# Patient Record
Sex: Female | Born: 1965 | Race: Black or African American | Hispanic: No | Marital: Married | Smoking: Never smoker
Health system: Southern US, Community
[De-identification: ages and names within clinical notes are randomized; demographics above are authoritative.]

## PROBLEM LIST (undated history)

## (undated) DIAGNOSIS — I10 Essential (primary) hypertension: Secondary | ICD-10-CM

---

## 2019-06-03 ENCOUNTER — Institutional Professional Consult (permissible substitution): Payer: Self-pay | Admitting: Critical Care Medicine

## 2020-01-25 ENCOUNTER — Other Ambulatory Visit: Payer: Self-pay

## 2020-01-25 ENCOUNTER — Emergency Department (INDEPENDENT_AMBULATORY_CARE_PROVIDER_SITE_OTHER): Payer: BLUE CROSS/BLUE SHIELD

## 2020-01-25 ENCOUNTER — Emergency Department
Admission: RE | Admit: 2020-01-25 | Discharge: 2020-01-25 | Disposition: A | Discharge status: Home / Self Care | Payer: BLUE CROSS/BLUE SHIELD | Source: Ambulatory Visit | Attending: Family Medicine | Admitting: Family Medicine

## 2020-01-25 VITALS — BP 144/83 | HR 92 | Temp 98.4°F | Resp 16 | Ht 66.0 in | Wt 190.0 lb

## 2020-01-25 DIAGNOSIS — M25561 Pain in right knee: Secondary | ICD-10-CM | POA: Diagnosis not present

## 2020-01-25 DIAGNOSIS — R2241 Localized swelling, mass and lump, right lower limb: Secondary | ICD-10-CM

## 2020-01-25 DIAGNOSIS — M1711 Unilateral primary osteoarthritis, right knee: Secondary | ICD-10-CM | POA: Diagnosis not present

## 2020-01-25 HISTORY — DX: Essential (primary) hypertension: I10

## 2020-01-25 NOTE — ED Provider Notes (Signed)
[facility]    CSN: [id number] Arrival date & time: 01/25/20  1806      History   Chief Complaint Chief Complaint  Patient presents with  . Knee Pain    HPI Tiffany Alvarez is a 55 y.o. female.   Yesterday afternoon while taking a shower, patient's right knee suddenly gave way followed by pain/swelling.  Her knee now feels better but it still feels unstable.  She recalls no recent injury or change in activities.  She has had mild improvement after taking Advil 400mg   The history is provided by the patient.  Knee Pain Location:  Knee Time since incident:  1 day Injury: no   Knee location:  R knee Pain details:    Quality:  Aching   Radiates to:  Does not radiate   Severity:  Moderate   Onset quality:  Sudden   Duration:  1 day   Timing:  Constant   Progression:  Improving Chronicity:  New Dislocation: no   Prior injury to area:  No Relieved by:  Nothing Worsened by:  Extension and bearing weight Associated symptoms: decreased ROM, stiffness and swelling   Associated symptoms: no muscle weakness     Past Medical History:  Diagnosis Date  . Hypertension     There are no problems to display for this patient.   Past Surgical History:  Procedure Laterality Date  . CESAREAN SECTION      OB History   No obstetric history on file.      Home Medications    Prior to Admission medications   Medication Sig Start Date End Date Taking? Authorizing Provider  amLODipine (NORVASC) 10 MG tablet Take 10 mg by mouth daily.   Yes [provider]  ergocalciferol (VITAMIN D2) 1.25 MG (50000 UT) capsule Take 50,000 Units by mouth once a week.   Yes [provider]  fexofenadine (ALLEGRA) 60 MG tablet Take 60 mg by mouth 2 (two) times daily.   Yes [provider]  norethindrone-ethinyl estradiol (LOESTRIN FE) 1-20 MG-MCG tablet Take 1 tablet by mouth daily.   Yes [provider]    Family History No family history on  file.  Social History Social History   Tobacco Use  . Smoking status: Never Smoker  . Smokeless tobacco: Never Used     Allergies   Lisinopril   Review of Systems Review of Systems  Musculoskeletal: Positive for stiffness.  All other systems reviewed and are negative.    Physical Exam Triage Vital Signs ED Triage Vitals  Enc Vitals Group     BP 01/25/20 1839 (!) 144/83     Pulse Rate 01/25/20 1839 92     Resp 01/25/20 1839 16     Temp 01/25/20 1839 98.4 F (36.9 C)     Temp Source 01/25/20 1839 Oral     SpO2 01/25/20 1839 97 %     Weight 01/25/20 1844 190 lb (86.2 kg)     Height 01/25/20 1844 5\' 6"  (1.676 m)     Head Circumference --      Peak Flow --      Pain Score 01/25/20 1843 4     Pain Loc --      Pain Edu? --      Excl. in GC? --    No data found.  Updated Vital Signs BP (!) 144/83 (BP Location: Right Arm)   Pulse 92   Temp 98.4 F (36.9 C) (Oral)   Resp 16  Ht 5\' 6"  (1.676 m)   Wt 86.2 kg   SpO2 97%   BMI 30.67 kg/m   Visual Acuity Right Eye Distance:   Left Eye Distance:   Bilateral Distance:    Right Eye Near:   Left Eye Near:    Bilateral Near:     Physical Exam Vitals and nursing note reviewed.  Constitutional:      General: She is not in acute distress. HENT:     Head: Normocephalic.  Cardiovascular:     Rate and Rhythm: Normal rate.  Pulmonary:     Effort: Pulmonary effort is normal.  Musculoskeletal:     Right knee: No swelling, deformity, erythema, ecchymosis or crepitus. Decreased range of motion. Tenderness present over the patellar tendon. No LCL laxity, MCL laxity, ACL laxity or PCL laxity. Normal alignment, normal meniscus and normal patellar mobility.       Legs:     Comments: Right knee has relatively good range of motion although patient has pain with active extension.  There is tenderness to palpation along lateral and inferior edge of patella.  Skin:    General: Skin is warm and dry.     Findings: No rash.   Neurological:     Mental Status: She is alert.      UC Treatments / Results  Labs (all labs ordered are listed, but only abnormal results are displayed) Labs Reviewed - No data to display  EKG   Radiology DG Knee Complete 4 Views Right  Result Date: 01/25/2020 CLINICAL DATA:  Pain and swelling EXAM: RIGHT KNEE - COMPLETE 4+ VIEW COMPARISON:  None. FINDINGS: No fracture or malalignment. Minimal patellofemoral degenerative change. Trace knee effusion. IMPRESSION: Minimal degenerative change with trace knee effusion. Electronically Signed   By: 01/27/2020 M.D.   On: 01/25/2020 20:14    Procedures Procedures (including critical care time)  Medications Ordered in UC Medications - No data to display  Initial Impression / Assessment and Plan / UC Course  I have reviewed the triage vital signs and the nursing notes.  Pertinent labs & imaging results that were available during my care of the patient were reviewed by me and considered in my medical decision making (see chart for details).    Dispensed hinged knee brace.  Begin range of motion and stretching exercises.  Given instructions for range of motion and stretching exercises.  Followup with Dr[facility] 01/27/2020 ([facility]) if not improving about two weeks.    Final Clinical Impressions(s) / UC Diagnoses   Final diagnoses:  Patellofemoral arthritis of right knee     Discharge Instructions     Apply ice pack for 20 to 30 minutes, 3 to 4 times daily  Continue until pain and swelling decrease.   Wear knee brace for about 2 weeks.  May take Ibuprofen 200mg , 4 tabs every 8 hours with food.     ED Prescriptions    None        Rodney Langton, MD 01/26/20 1851

## 2020-01-25 NOTE — ED Triage Notes (Signed)
Patient here for right knee pain which started yesterday; no known injury; has episodes of knee pain bilaterally periodically without previous diagnosis. Has had covid vaccinations x 2 doses; no influenza vaccination. Took ibuprofen 400mg  po at 1600.

## 2020-01-25 NOTE — Discharge Instructions (Addendum)
Apply ice pack for 20 to 30 minutes, 3 to 4 times daily  Continue until pain and swelling decrease.   Wear knee brace for about 2 weeks.  May take Ibuprofen 200mg , 4 tabs every 8 hours with food.

## 2020-05-02 ENCOUNTER — Emergency Department
Admission: EM | Admit: 2020-05-02 | Discharge: 2020-05-02 | Disposition: A | Discharge status: Home / Self Care | Payer: BLUE CROSS/BLUE SHIELD | Source: Home / Self Care | Attending: Family Medicine | Admitting: Family Medicine

## 2020-05-02 ENCOUNTER — Emergency Department: Admit: 2020-05-02 | Discharge status: Absent without leave | Payer: Self-pay

## 2020-05-02 DIAGNOSIS — R1011 Right upper quadrant pain: Secondary | ICD-10-CM | POA: Diagnosis not present

## 2020-05-02 NOTE — ED Provider Notes (Signed)
[facility]    CSN: [id number] Arrival date & time: 05/02/20  1912      History   Chief Complaint Chief Complaint  Patient presents with  . Abdominal Pain    HPI Tiffany Alvarez is a 55 y.o. female.   HPI 55 year old female presents for intermittent abdominal pain/discomfort for 6 to 7 years.  Reports she is currently menstruating.  Reports abdominal pain only occurs prior to bowel movement.  Patient reports having first colonoscopy at age 78 without complications or polyps.  Reports was advised to have next colonoscopy at age 37.  Patient reports having soft formed bowel movement daily or at least every other day.  She denies diarrhea, hematochezia, or melena.  Denies dysuria, hematuria, increased urinary frequency or difficulty urinating.  Past Medical History:  Diagnosis Date  . Hypertension     There are no problems to display for this patient.   Past Surgical History:  Procedure Laterality Date  . CESAREAN SECTION      OB History   No obstetric history on file.      Home Medications    Prior to Admission medications   Medication Sig Start Date End Date Taking? Authorizing Provider  amLODipine (NORVASC) 10 MG tablet Take 10 mg by mouth daily.    [provider]  ergocalciferol (VITAMIN D2) 1.25 MG (50000 UT) capsule Take 50,000 Units by mouth once a week.    [provider]  fexofenadine (ALLEGRA) 60 MG tablet Take 60 mg by mouth 2 (two) times daily.    [provider]  norethindrone-ethinyl estradiol (LOESTRIN FE) 1-20 MG-MCG tablet Take 1 tablet by mouth daily.    [provider]    Family History History reviewed. No pertinent family history.  Social History Social History   Tobacco Use  . Smoking status: Never Smoker  . Smokeless tobacco: Never Used  Vaping Use  . Vaping Use: Never used  Substance Use Topics  . Alcohol use: Not Currently     Allergies   Lisinopril   Review of Systems Review of  Systems  Constitutional: Negative.   HENT: Negative.   Respiratory: Negative.   Cardiovascular: Negative.   Gastrointestinal: Positive for abdominal pain. Negative for anal bleeding, blood in stool, constipation, diarrhea, nausea, rectal pain and vomiting.  Endocrine: Negative.   Genitourinary: Negative.   Musculoskeletal: Negative.   Neurological: Negative.      Physical Exam Triage Vital Signs ED Triage Vitals  Enc Vitals Group     BP 05/02/20 1951 (!) 161/83     Pulse Rate 05/02/20 1951 79     Resp 05/02/20 1951 18     Temp 05/02/20 1951 98.8 F (37.1 C)     Temp Source 05/02/20 1951 Oral     SpO2 05/02/20 1951 100 %     Weight --      Height --      Head Circumference --      Peak Flow --      Pain Score 05/02/20 1955 0     Pain Loc --      Pain Edu? --      Excl. in GC? --    No data found.  Updated Vital Signs BP (!) 161/83 (BP Location: Right Arm)   Pulse 79   Temp 98.8 F (37.1 C) (Oral)   Resp 18   LMP 04/30/2020   SpO2 100%   Visual Acuity Right Eye Distance:   Left Eye Distance:   Bilateral  Distance:    Right Eye Near:   Left Eye Near:    Bilateral Near:     Physical Exam Constitutional:      Appearance: She is well-developed. She is obese.  HENT:     Head: Normocephalic and atraumatic.     Mouth/Throat:     Mouth: Mucous membranes are moist.  Cardiovascular:     Rate and Rhythm: Normal rate and regular rhythm.     Heart sounds: Normal heart sounds.  Pulmonary:     Effort: Pulmonary effort is normal.     Breath sounds: Normal breath sounds.  Abdominal:     General: Abdomen is flat. Bowel sounds are normal. There is no distension.     Palpations: Abdomen is soft. There is no hepatomegaly, splenomegaly, mass or pulsatile mass.     Tenderness: There is abdominal tenderness in the right upper quadrant. There is no right CVA tenderness, left CVA tenderness, guarding or rebound. Negative signs include Murphy's sign and McBurney's sign.   Skin:    General: Skin is warm and dry.  Neurological:     General: No focal deficit present.     Mental Status: She is alert and oriented to person, place, and time.  Psychiatric:        Mood and Affect: Mood normal.        Behavior: Behavior normal.      UC Treatments / Results  Labs (all labs ordered are listed, but only abnormal results are displayed) Labs Reviewed - No data to display  EKG   Radiology No results found.  Procedures Procedures (including critical care time)  Medications Ordered in UC Medications - No data to display  Initial Impression / Assessment and Plan / UC Course  I have reviewed the triage vital signs and the nursing notes.  Pertinent labs & imaging results that were available during my care of the patient were reviewed by me and considered in my medical decision making (see chart for details).     1. Abdominal pain Final Clinical Impressions(s) / UC Diagnoses   Final diagnoses:  Right upper quadrant abdominal pain     Discharge Instructions     Advised patient to follow-up with [place] on Wednesday morning, 05/04/2020 for CT of abdomen and pelvis without contrast.    ED Prescriptions    None     PDMP not reviewed this encounter.   Trevor Iha, FNP 05/02/20 2026

## 2020-05-02 NOTE — ED Triage Notes (Signed)
Pt c/o abd pain intermittently for 6-7 years. Currently menstruating. Pt states she notices that she only gets the abd pain before she takes a bowel movement. Also states she feels bloated at times. Has seen GYN about this issue in past and wrote it off as ovulation cramps. Happens more frequently than that.

## 2020-05-02 NOTE — Discharge Instructions (Addendum)
Advised patient to follow-up with [place] on Wednesday morning, 05/04/2020 for CT of abdomen and pelvis without contrast.

## 2020-05-04 ENCOUNTER — Telehealth: Payer: Self-pay | Admitting: Family Medicine

## 2020-05-04 ENCOUNTER — Emergency Department (INDEPENDENT_AMBULATORY_CARE_PROVIDER_SITE_OTHER)
Admission: EM | Admit: 2020-05-04 | Discharge: 2020-05-04 | Disposition: A | Discharge status: Home / Self Care | Payer: BLUE CROSS/BLUE SHIELD | Source: Home / Self Care

## 2020-05-04 ENCOUNTER — Other Ambulatory Visit: Payer: Self-pay

## 2020-05-04 ENCOUNTER — Emergency Department (INDEPENDENT_AMBULATORY_CARE_PROVIDER_SITE_OTHER): Payer: BLUE CROSS/BLUE SHIELD

## 2020-05-04 DIAGNOSIS — R1011 Right upper quadrant pain: Secondary | ICD-10-CM

## 2020-05-04 DIAGNOSIS — R1031 Right lower quadrant pain: Secondary | ICD-10-CM | POA: Diagnosis not present

## 2020-05-04 LAB — I-STAT CREATININE (MANUAL ENTRY): Creatinine, Ser: 0.8 (ref 0.50–1.10)

## 2020-05-04 MED ORDER — IOHEXOL 300 MG/ML  SOLN
100.0000 mL | Freq: Once | INTRAMUSCULAR | Status: AC | PRN
  Administered 2020-05-04: 100 mL via INTRAVENOUS

## 2020-05-04 NOTE — Telephone Encounter (Signed)
Patient returned my call ([phone]) regarding previous voicemail message regarding results of this morning CT of abdomen/pelvis with contrast.  Impression was provided to patient during this phone call and advised follow-up with her PCP to order recommended pre-/post contrasted MRI of abdomen by radiology due to solid-appearing 1.0 cm right renal mass-this finding suspicious for renal cell carcinoma.  Additionally indeterminate 1.4 cm right adrenal gland nodule could be further characterized on recommended abdominal MRI.  Patient agreed and verbalized understanding of these instructions and reports she will call her PCP Erling Cruz, MD) now to schedule appointment.

## 2020-05-04 NOTE — Telephone Encounter (Signed)
Called to speak with patient today at 1029 a.m. to review this morning's CT of abdomen and pelvis with contrast impression below.  1.  No acute abdominopelvic findings.  Normal appendix. 2.  Solid-appearing 1.0 cm right renal mass.  Further evaluation with nonemergent pre-/post contrasted MRI of the abdomen is recommended as this finding suspicious for renal cell carcinoma. 3.  Trace bilateral pleural effusions. 4.  Indeterminate 1.4 cm right adrenal gland nodule this can also be further characterized on previously recommended abdominal MRI. 5.  2 low-density lesions within the spleen measuring up to 8 mm which may represent cysts or hemangiomas. 6.  Fibroid uterus.  Advised patient to follow-up with her PCP for recommended MRI of abdomen.

## 2021-05-31 IMAGING — DX DG KNEE COMPLETE 4+V*R*
4 series · 4 of 4 positions shown · non-contrast
Comparison: None.

CLINICAL DATA: Pain and swelling

EXAM:
RIGHT KNEE - COMPLETE 4+ VIEW

[knee ap]
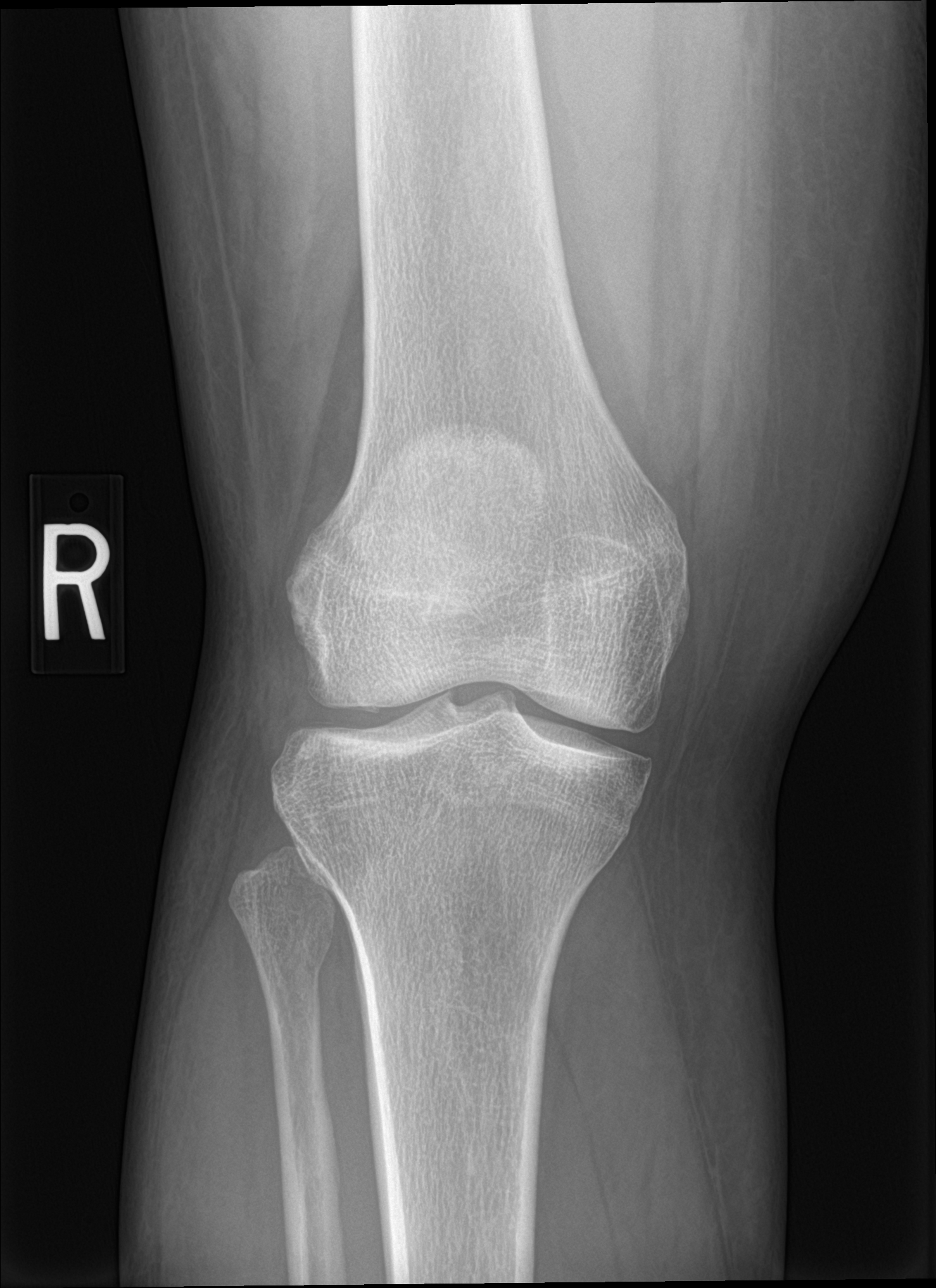

[knee obl (1 of 2)]
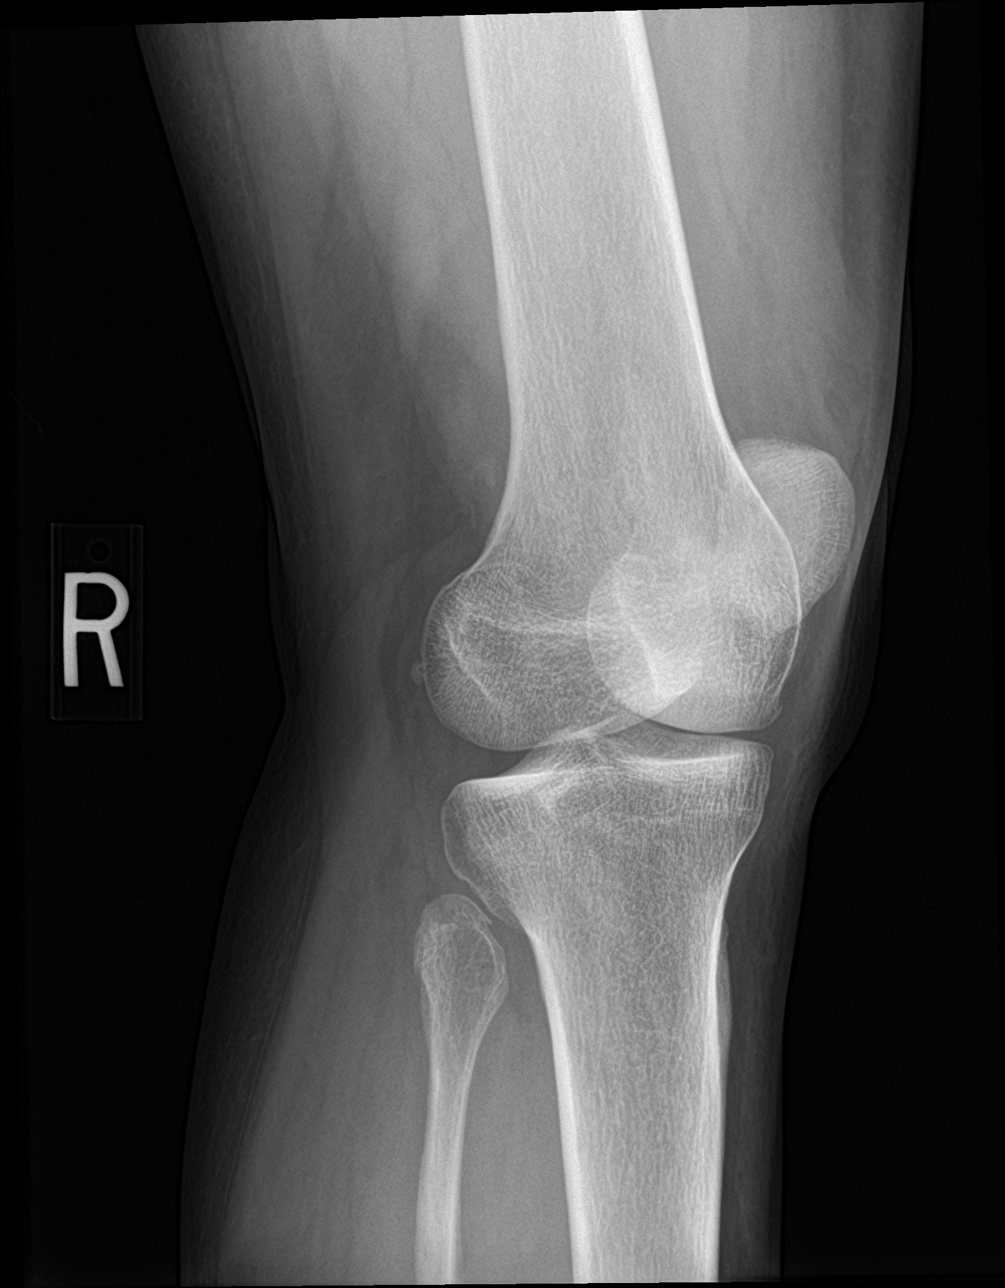

[knee lat]
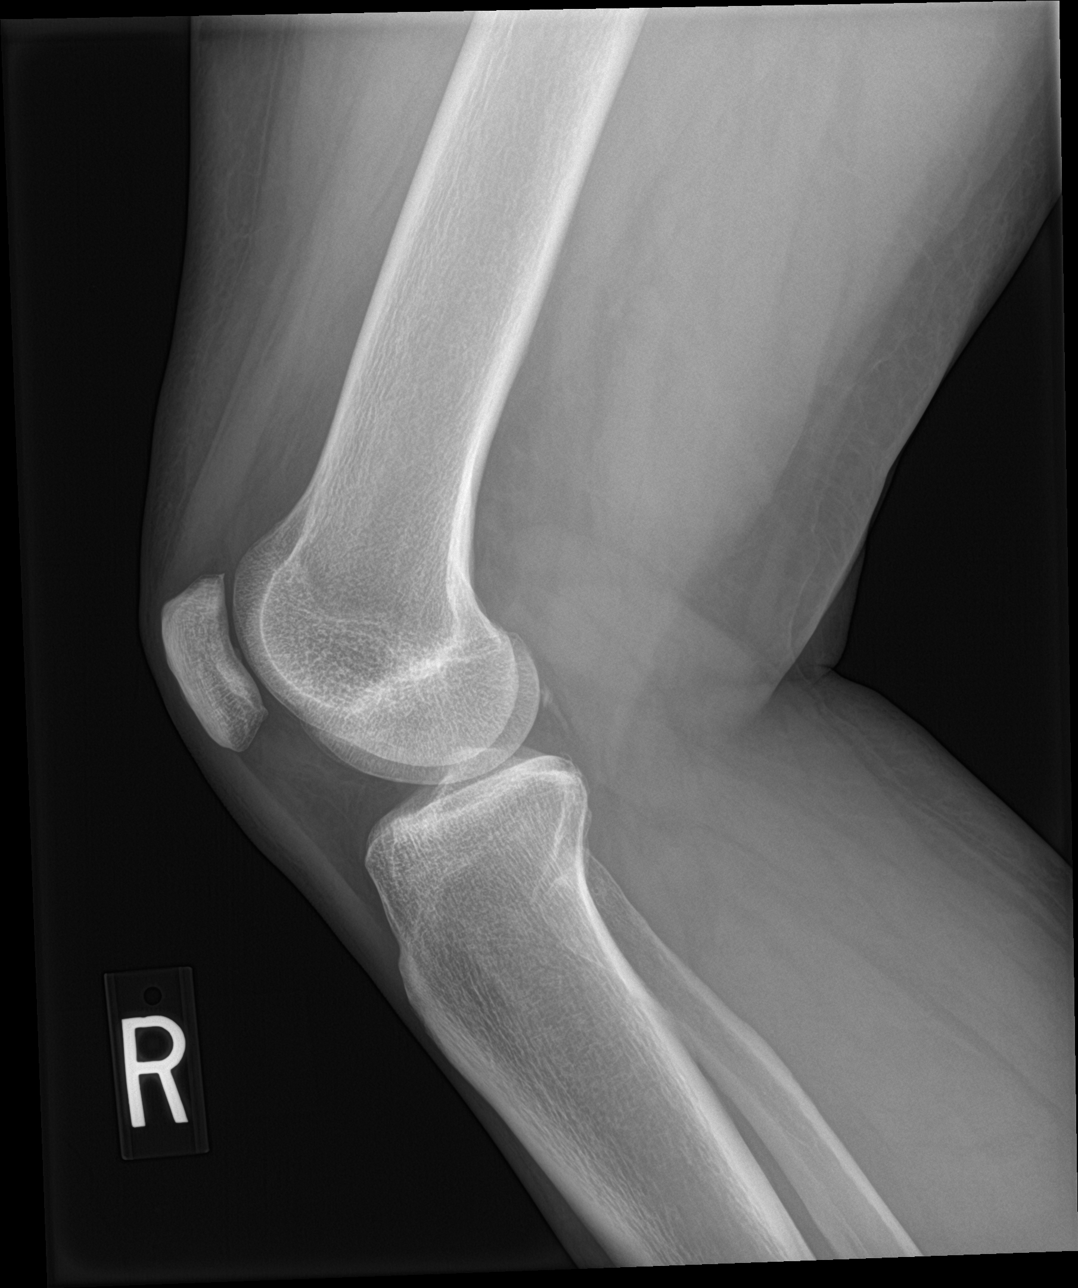

[knee obl (2 of 2)]
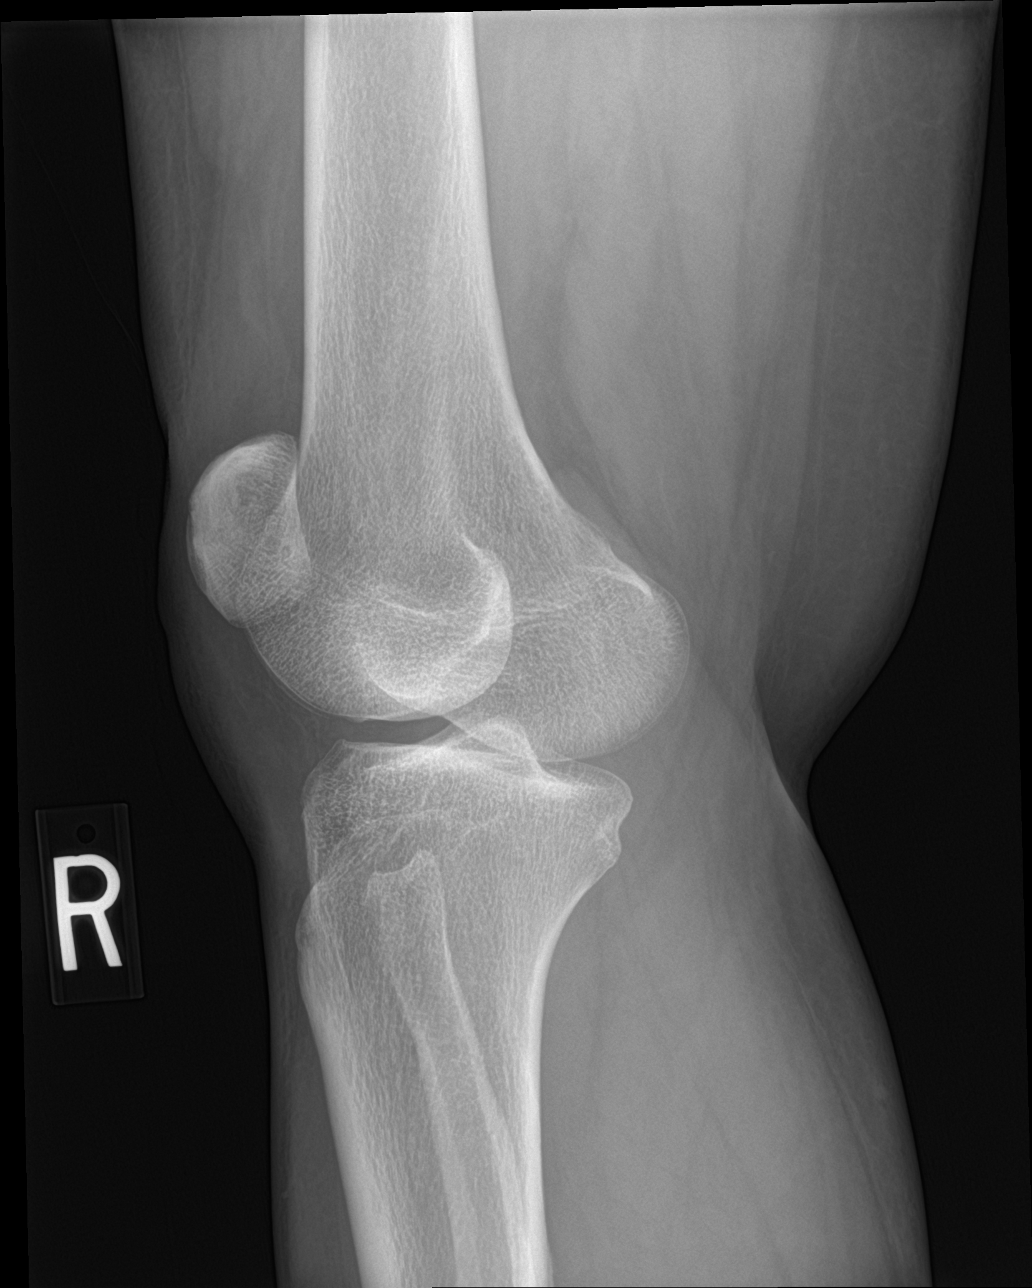

[4 of 4 positions shown; findings below may reference images not displayed]

FINDINGS: No fracture or malalignment. Minimal patellofemoral degenerative
change. Trace knee effusion.
IMPRESSION: Minimal degenerative change with trace knee effusion.

## 2023-04-30 ENCOUNTER — Ambulatory Visit: Admission: EM | Admit: 2023-04-30 | Discharge: 2023-04-30 | Disposition: A | Discharge status: Home / Self Care

## 2023-04-30 ENCOUNTER — Ambulatory Visit

## 2023-04-30 DIAGNOSIS — M542 Cervicalgia: Secondary | ICD-10-CM

## 2023-04-30 DIAGNOSIS — S161XXA Strain of muscle, fascia and tendon at neck level, initial encounter: Secondary | ICD-10-CM | POA: Diagnosis not present

## 2023-04-30 MED ORDER — PREDNISONE 10 MG (21) PO TBPK
ORAL_TABLET | Freq: Every day | ORAL | 0 refills | Status: AC
Start: 2023-04-30 — End: ?

## 2023-04-30 MED ORDER — BACLOFEN 10 MG PO TABS
10.0000 mg | ORAL_TABLET | Freq: Three times a day (TID) | ORAL | 0 refills | Status: AC
Start: 2023-04-30 — End: ?

## 2023-04-30 MED ORDER — CELECOXIB 200 MG PO CAPS: 200.0000 mg | ORAL_CAPSULE | Freq: Every day | ORAL | 0 refills | Status: AC
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
## 2023-04-30 NOTE — Discharge Instructions (Addendum)
 Advised patient to take medications as directed with food to completion.  Advised may use baclofen daily or as needed for accompanying neck spasms.  Encouraged increase daily water intake to 64 ounces per day while taking these medications.  Advised if symptoms worsen and/or unresolved please follow-up with your PCP, [facility], or here for further evaluation.
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
